# Patient Record
Sex: Male | Born: 1990 | Race: White | Hispanic: No | Marital: Single | Smoking: Never smoker
Health system: Southern US, Community
[De-identification: ages and names within clinical notes are randomized; demographics above are authoritative.]

---

## 2014-10-30 ENCOUNTER — Emergency Department (HOSPITAL_COMMUNITY)
Admission: EM | Admit: 2014-10-30 | Discharge: 2014-10-30 | Disposition: A | Discharge status: Home / Self Care | Payer: Self-pay | Attending: Emergency Medicine | Admitting: Emergency Medicine

## 2014-10-30 ENCOUNTER — Encounter (HOSPITAL_COMMUNITY): Payer: Self-pay | Admitting: *Deleted

## 2014-10-30 ENCOUNTER — Emergency Department (HOSPITAL_COMMUNITY): Payer: Self-pay

## 2014-10-30 DIAGNOSIS — R1011 Right upper quadrant pain: Secondary | ICD-10-CM | POA: Insufficient documentation

## 2014-10-30 DIAGNOSIS — R1110 Vomiting, unspecified: Secondary | ICD-10-CM | POA: Insufficient documentation

## 2014-10-30 DIAGNOSIS — R0789 Other chest pain: Secondary | ICD-10-CM | POA: Insufficient documentation

## 2014-10-30 DIAGNOSIS — R52 Pain, unspecified: Secondary | ICD-10-CM

## 2014-10-30 DIAGNOSIS — R109 Unspecified abdominal pain: Secondary | ICD-10-CM

## 2014-10-30 LAB — CBC WITH DIFFERENTIAL/PLATELET
BASOS ABS: 0 10*3/uL (ref 0.0–0.1)
BASOS PCT: 0 % (ref 0–1)
EOS ABS: 0.2 10*3/uL (ref 0.0–0.7)
Eosinophils Relative: 1 % (ref 0–5)
HEMATOCRIT: 43.8 % (ref 39.0–52.0)
HEMOGLOBIN: 14.4 g/dL (ref 13.0–17.0)
Lymphocytes Relative: 22 % (ref 12–46)
Lymphs Abs: 2.5 10*3/uL (ref 0.7–4.0)
MCH: 28.7 pg (ref 26.0–34.0)
MCHC: 32.9 g/dL (ref 30.0–36.0)
MCV: 87.3 fL (ref 78.0–100.0)
MONO ABS: 1 10*3/uL (ref 0.1–1.0)
Monocytes Relative: 9 % (ref 3–12)
NEUTROS ABS: 7.7 10*3/uL (ref 1.7–7.7)
Neutrophils Relative %: 68 % (ref 43–77)
Platelets: 246 10*3/uL (ref 150–400)
RBC: 5.02 MIL/uL (ref 4.22–5.81)
RDW: 14.3 % (ref 11.5–15.5)
WBC: 11.5 10*3/uL — AB (ref 4.0–10.5)

## 2014-10-30 LAB — URINALYSIS, ROUTINE W REFLEX MICROSCOPIC
Bilirubin Urine: NEGATIVE
Glucose, UA: NEGATIVE mg/dL
Hgb urine dipstick: NEGATIVE
Ketones, ur: NEGATIVE mg/dL
LEUKOCYTES UA: NEGATIVE
NITRITE: NEGATIVE
PROTEIN: NEGATIVE mg/dL
Specific Gravity, Urine: 1.01 (ref 1.005–1.030)
Urobilinogen, UA: 0.2 mg/dL (ref 0.0–1.0)
pH: 6 (ref 5.0–8.0)

## 2014-10-30 LAB — COMPREHENSIVE METABOLIC PANEL
ALT: 46 U/L (ref 0–53)
ANION GAP: 5 (ref 5–15)
AST: 32 U/L (ref 0–37)
Albumin: 4 g/dL (ref 3.5–5.2)
Alkaline Phosphatase: 69 U/L (ref 39–117)
BILIRUBIN TOTAL: 0.7 mg/dL (ref 0.3–1.2)
BUN: 16 mg/dL (ref 6–23)
CO2: 25 mmol/L (ref 19–32)
CREATININE: 1.01 mg/dL (ref 0.50–1.35)
Calcium: 9.6 mg/dL (ref 8.4–10.5)
Chloride: 108 mmol/L (ref 96–112)
GFR calc Af Amer: >90 mL/min (ref >90)
Glucose, Bld: 89 mg/dL (ref 70–99)
POTASSIUM: 4.5 mmol/L (ref 3.5–5.1)
Sodium: 138 mmol/L (ref 135–145)
Total Protein: 7.7 g/dL (ref 6.0–8.3)

## 2014-10-30 LAB — LIPASE, BLOOD: LIPASE: 23 U/L (ref 11–59)

## 2014-10-30 LAB — TROPONIN I: Troponin I: <0.03 ng/mL (ref <0.031)

## 2014-10-30 MED ORDER — HYDROMORPHONE HCL 1 MG/ML IJ SOLN
0.5000 mg | Freq: Once | INTRAMUSCULAR | Status: AC
  Administered 2014-10-30: 0.5 mg via INTRAVENOUS

## 2014-10-30 MED ORDER — TRAMADOL HCL 50 MG PO TABS: 50.0000 mg | ORAL_TABLET | Freq: Four times a day (QID) | ORAL | Status: AC | PRN

## 2014-10-30 MED ORDER — IOHEXOL 300 MG/ML  SOLN
50.0000 mL | Freq: Once | INTRAMUSCULAR | Status: AC | PRN
  Administered 2014-10-30: 50 mL via ORAL

## 2014-10-30 MED ORDER — IOHEXOL 300 MG/ML  SOLN
100.0000 mL | Freq: Once | INTRAMUSCULAR | Status: AC | PRN
  Administered 2014-10-30: 100 mL via INTRAVENOUS

## 2014-10-30 MED ORDER — HYDROMORPHONE HCL 1 MG/ML IJ SOLN
0.5000 mg | Freq: Once | INTRAMUSCULAR | Status: AC
  Administered 2014-10-30: 0.5 mg via INTRAVENOUS
  Filled 2014-10-30: qty 1

## 2014-10-30 MED ORDER — ONDANSETRON HCL 4 MG/2ML IJ SOLN
4.0000 mg | Freq: Once | INTRAMUSCULAR | Status: AC
  Administered 2014-10-30: 4 mg via INTRAVENOUS
  Filled 2014-10-30: qty 2

## 2014-10-30 MED ORDER — HYDROMORPHONE HCL 1 MG/ML IJ SOLN
INTRAMUSCULAR | Status: AC
  Administered 2014-10-30: 0.5 mg via INTRAVENOUS
  Filled 2014-10-30: qty 1

## 2014-10-30 MED ORDER — RANITIDINE HCL 150 MG PO CAPS: 150.0000 mg | ORAL_CAPSULE | Freq: Every day | ORAL | Status: AC

## 2014-10-30 MED ORDER — PROMETHAZINE HCL 25 MG PO TABS: 25.0000 mg | ORAL_TABLET | Freq: Four times a day (QID) | ORAL | Status: AC | PRN

## 2014-10-30 NOTE — ED Notes (Signed)
Abdominal pain, described as tightness. Entire back pain and tightness to chest since Friday. Pt sates vomiting since Monday. States he is unable to keep anything down. Headache also. NAD

## 2014-10-30 NOTE — Discharge Instructions (Signed)
Follow up with dr. Darrick PennaFields in 1-2 weeks if not improving.

## 2014-10-30 NOTE — ED Provider Notes (Signed)
CSN: [id number]     Arrival date & time 10/30/14  1033 History  This chart was scribed for Benny LennertJoseph L Ewel Lona, MD by Abel PrestoKara Demonbreun, ED Scribe. This patient was seen in room [facility]04/[facility]04 and the patient's care was started at 12:06 PM.    Chief Complaint  Patient presents with  . Abdominal Pain     Patient is a 24 y.o. male presenting with abdominal pain. The history is provided by the patient (the pt complains of upper abd pain). No language interpreter was used.  Abdominal Pain Pain location:  Epigastric Pain quality: aching   Pain radiates to:  Does not radiate Pain severity:  Moderate Onset quality:  Gradual Timing:  Intermittent Progression:  Waxing and waning Chronicity:  New Associated symptoms: vomiting   Associated symptoms: no chest pain, no cough, no diarrhea, no fatigue and no hematuria    HPI Comments: Steven Mercer is a 24 y.o. male who presents to the Emergency Department complaining of upper abdominal pain with onset 5 days ago. Pt states pain radiates to his sides. Pt notes associated chest tightness and vomiting for 3 days. Pt denies h/o of abdominal surgery and cholecystitis.   History reviewed. No pertinent past medical history. History reviewed. No pertinent past surgical history. No family history on file. History  Substance Use Topics  . Smoking status: Never Smoker   . Smokeless tobacco: Not on file  . Alcohol Use: Yes     Comment: Occ    Review of Systems  Constitutional: Negative for appetite change and fatigue.  HENT: Negative for congestion, ear discharge and sinus pressure.   Eyes: Negative for discharge.  Respiratory: Positive for chest tightness. Negative for cough.   Cardiovascular: Negative for chest pain.  Gastrointestinal: Positive for vomiting and abdominal pain. Negative for diarrhea.  Genitourinary: Negative for frequency and hematuria.  Musculoskeletal: Negative for back pain.  Skin: Negative for rash.  Neurological: Negative for  seizures and headaches.  Psychiatric/Behavioral: Negative for hallucinations.      Allergies  Review of patient's allergies indicates no known allergies.  Home Medications   Prior to Admission medications   Not on File   BP 133/80 mmHg  Pulse 83  Temp(Src) 99.2 F (37.3 C) (Oral)  Resp 18  Ht 6\' 1"  (1.854 m)  Wt 370 lb (167.831 kg)  BMI 48.83 kg/m2  SpO2 100% Physical Exam  Constitutional: He is oriented to person, place, and time. He appears well-developed and well-nourished.  HENT:  Head: Normocephalic.  Eyes: Conjunctivae and EOM are normal. No scleral icterus.  Neck: Neck supple. No thyromegaly present.  Cardiovascular: Normal rate, regular rhythm and normal heart sounds.  Exam reveals no gallop and no friction rub.   No murmur heard. Pulmonary/Chest: Effort normal and breath sounds normal. No stridor. He has no wheezes. He has no rales. He exhibits no tenderness.  Abdominal: He exhibits no distension. There is tenderness (moderate) in the right upper quadrant. There is no rebound.  Musculoskeletal: Normal range of motion. He exhibits no edema.  Lymphadenopathy:    He has no cervical adenopathy.  Neurological: He is alert and oriented to person, place, and time. He exhibits normal muscle tone. Coordination normal.  Skin: No rash noted. No erythema.  Psychiatric: He has a normal mood and affect. His behavior is normal.  Nursing note and vitals reviewed.   ED Course  Procedures (including critical care time) DIAGNOSTIC STUDIES: Oxygen Saturation is 100% on room air, normal by my interpretation.  COORDINATION OF CARE: 12:08 PM Discussed treatment plan with patient at beside, the patient agrees with the plan and has no further questions at this time.   Labs Review Labs Reviewed  CBC WITH DIFFERENTIAL/PLATELET  COMPREHENSIVE METABOLIC PANEL  LIPASE, BLOOD  URINALYSIS, ROUTINE W REFLEX MICROSCOPIC  TROPONIN I    Imaging Review No results found.   EKG  Interpretation None      MDM   Final diagnoses:  None   The chart was scribed for me under my direct supervision.  I personally performed the history, physical, and medical decision making and all procedures in the evaluation of this patient.Benny Lennert, MD 10/30/14 [phone]

## 2014-10-30 NOTE — ED Notes (Signed)
Ultrasound at the bedside

## 2015-05-02 ENCOUNTER — Emergency Department (HOSPITAL_COMMUNITY)
Admission: EM | Admit: 2015-05-02 | Discharge: 2015-05-02 | Disposition: A | Discharge status: Home / Self Care | Payer: Self-pay | Attending: Emergency Medicine | Admitting: Emergency Medicine

## 2015-05-02 ENCOUNTER — Encounter (HOSPITAL_COMMUNITY): Payer: Self-pay | Admitting: Emergency Medicine

## 2015-05-02 DIAGNOSIS — Y92810 Car as the place of occurrence of the external cause: Secondary | ICD-10-CM | POA: Insufficient documentation

## 2015-05-02 DIAGNOSIS — R519 Headache, unspecified: Secondary | ICD-10-CM

## 2015-05-02 DIAGNOSIS — Y9389 Activity, other specified: Secondary | ICD-10-CM | POA: Insufficient documentation

## 2015-05-02 DIAGNOSIS — S0990XA Unspecified injury of head, initial encounter: Secondary | ICD-10-CM | POA: Insufficient documentation

## 2015-05-02 DIAGNOSIS — W228XXA Striking against or struck by other objects, initial encounter: Secondary | ICD-10-CM | POA: Insufficient documentation

## 2015-05-02 DIAGNOSIS — R51 Headache: Secondary | ICD-10-CM

## 2015-05-02 DIAGNOSIS — Y998 Other external cause status: Secondary | ICD-10-CM | POA: Insufficient documentation

## 2015-05-02 MED ORDER — KETOROLAC TROMETHAMINE 30 MG/ML IJ SOLN
30.0000 mg | Freq: Once | INTRAMUSCULAR | Status: AC
  Administered 2015-05-02: 30 mg via INTRAVENOUS
  Filled 2015-05-02: qty 1

## 2015-05-02 MED ORDER — DIPHENHYDRAMINE HCL 50 MG/ML IJ SOLN
25.0000 mg | Freq: Once | INTRAMUSCULAR | Status: AC
  Administered 2015-05-02: 25 mg via INTRAVENOUS
  Filled 2015-05-02: qty 1

## 2015-05-02 MED ORDER — METOCLOPRAMIDE HCL 5 MG/ML IJ SOLN
10.0000 mg | Freq: Once | INTRAMUSCULAR | Status: AC
  Administered 2015-05-02: 10 mg via INTRAVENOUS
  Filled 2015-05-02: qty 2

## 2015-05-02 NOTE — ED Provider Notes (Signed)
CSN: [id number]     Arrival date & time 05/02/15  1431 History   First MD Initiated Contact with Patient 05/02/15 1501     Chief Complaint  Patient presents with  . Head Injury     (Consider location/radiation/quality/duration/timing/severity/associated sxs/prior Treatment) Patient is a 24 y.o. male presenting with head injury. The history is provided by the patient (pt complains of a headache when he hit his head on the door facing when he was getting in a car).  Head Injury Location:  Generalized Mechanism of injury: direct blow   Pain details:    Quality:  Aching   Progression:  Unchanged Chronicity:  New Relieved by:  Nothing Associated symptoms: no headaches and no seizures     History reviewed. No pertinent past medical history. History reviewed. No pertinent past surgical history. History reviewed. No pertinent family history. Social History  Substance Use Topics  . Smoking status: Never Smoker   . Smokeless tobacco: Current User    Types: Chew  . Alcohol Use: Yes     Comment: Occ    Review of Systems  Constitutional: Negative for appetite change and fatigue.  HENT: Negative for congestion, ear discharge and sinus pressure.   Eyes: Negative for discharge.  Respiratory: Negative for cough.   Cardiovascular: Negative for chest pain.  Gastrointestinal: Negative for abdominal pain and diarrhea.  Genitourinary: Negative for frequency and hematuria.  Musculoskeletal: Negative for back pain.  Skin: Negative for rash.  Neurological: Negative for seizures and headaches.  Psychiatric/Behavioral: Negative for hallucinations.      Allergies  Review of patient's allergies indicates no known allergies.  Home Medications   Prior to Admission medications   Medication Sig Start Date End Date Taking? Authorizing Provider  acetaminophen (TYLENOL) 500 MG tablet Take 500 mg by mouth every 6 (six) hours as needed.   Yes Historical Provider, MD  promethazine (PHENERGAN) 25  MG tablet Take 1 tablet (25 mg total) by mouth every 6 (six) hours as needed for nausea or vomiting. Patient not taking: Reported on 05/02/2015 10/30/14   Bethann Berkshire, MD  ranitidine (ZANTAC) 150 MG capsule Take 1 capsule (150 mg total) by mouth daily. Patient not taking: Reported on 05/02/2015 10/30/14   Bethann Berkshire, MD  traMADol (ULTRAM) 50 MG tablet Take 1 tablet (50 mg total) by mouth every 6 (six) hours as needed. Patient not taking: Reported on 05/02/2015 10/30/14   Bethann Berkshire, MD   BP 120/70 mmHg  Pulse 79  Temp(Src) 98.3 F (36.8 C) (Oral)  Resp 18  Ht 6\' 2"  (1.88 m)  Wt 362 lb (164.202 kg)  BMI 46.46 kg/m2  SpO2 100% Physical Exam  Constitutional: He is oriented to person, place, and time. He appears well-developed.  HENT:  Head: Normocephalic.  Eyes: Conjunctivae and EOM are normal. No scleral icterus.  Neck: Neck supple. No thyromegaly present.  Cardiovascular: Normal rate and regular rhythm.  Exam reveals no gallop and no friction rub.   No murmur heard. Pulmonary/Chest: No stridor. He has no wheezes. He has no rales. He exhibits no tenderness.  Abdominal: He exhibits no distension. There is no tenderness. There is no rebound.  Musculoskeletal: Normal range of motion. He exhibits no edema.  Lymphadenopathy:    He has no cervical adenopathy.  Neurological: He is oriented to person, place, and time. He exhibits normal muscle tone. Coordination normal.  Skin: No rash noted. No erythema.  Psychiatric: He has a normal mood and affect. His behavior is normal.  ED Course  Procedures (including critical care time) Labs Review Labs Reviewed - No data to display  Imaging Review No results found. I have personally reviewed and evaluated these images and lab results as part of my medical decision-making.   EKG Interpretation None      MDM   Final diagnoses:  Headache behind the eyes   Minor head trauma causing a minor headache.  Pt improved with tx.      Bethann Berkshire, MD 05/02/15 [phone]

## 2015-05-02 NOTE — ED Notes (Signed)
Patient wanting to go home, EDP notified.

## 2015-05-02 NOTE — ED Notes (Addendum)
Pt reports hit head on the top of a car last night while trying to get in the back seat. Pt denies loc. Pt reports nausea and dizziness since last night.pt alert and oriented.airway patent. Pt reports "everything seems dim." nad noted.

## 2015-05-02 NOTE — Discharge Instructions (Signed)
Tylenol or motrin for pain.  Follow up as needed °

## 2016-05-13 IMAGING — CT CT ABD-PELV W/ CM
2 of 4 series · 16 of 46 positions shown, 18 images · IV contrast (Omnipaque 300)
Comparison: None.

CLINICAL DATA: Abdominal pain, back pain, vomiting starting [REDACTED]
headache

EXAM:
CT ABDOMEN AND PELVIS WITH CONTRAST
TECHNIQUE: Multidetector CT imaging of the abdomen and pelvis was performed
using the standard protocol following bolus administration of
intravenous contrast.
CONTRAST:  100mL OMNIPAQUE IOHEXOL 300 MG/ML SOLN, 50mL OMNIPAQUE
IOHEXOL 300 MG/ML SOLN

[Series 2: abd_pel_with 5.0 b40f · axial · 0.98mm/px · z∈[-553,-103]mm · 13 of 100 slices shown, 15 images]
[im 5/100  soft-tissue]
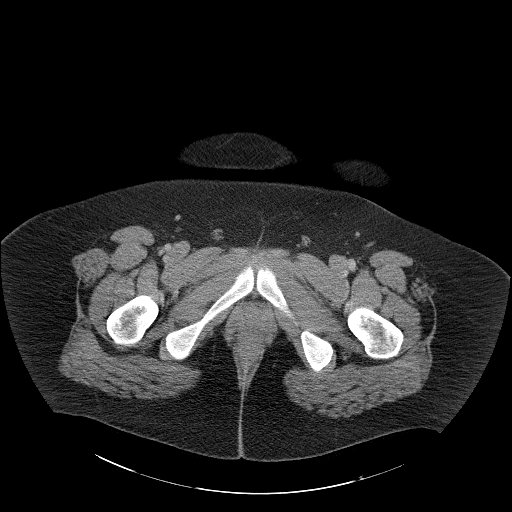
[im 5/100  bone]
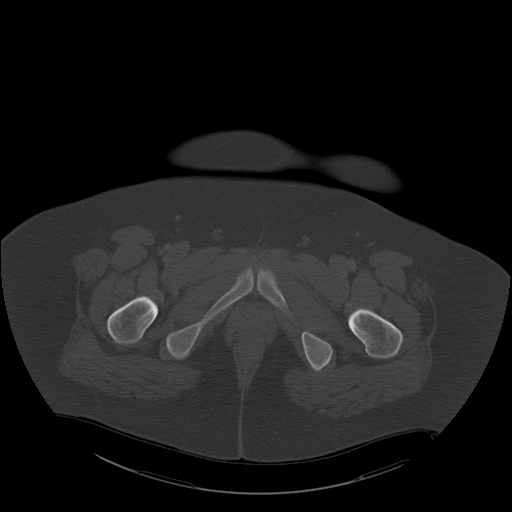
[im 13/100  soft-tissue]
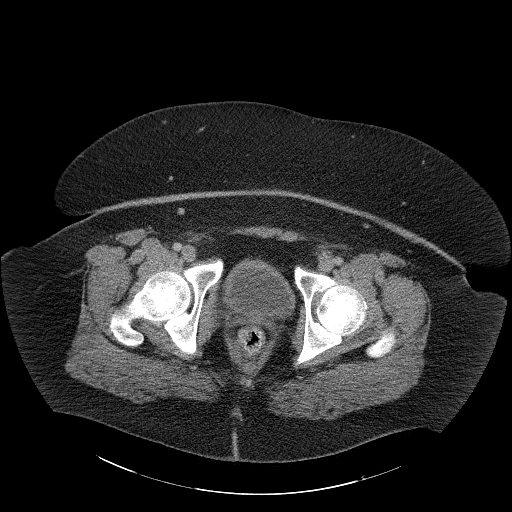
[im 22/100  soft-tissue]
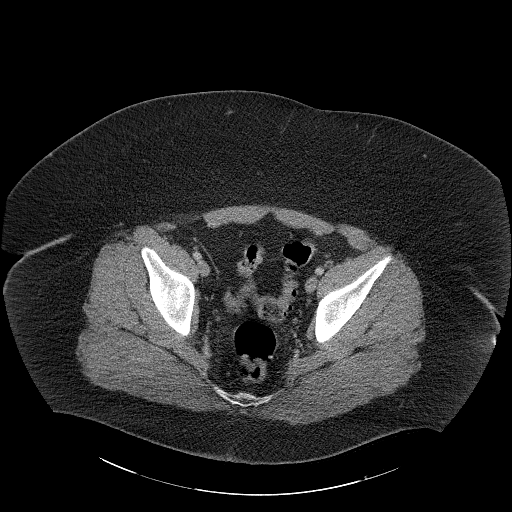
[im 26/100  soft-tissue]
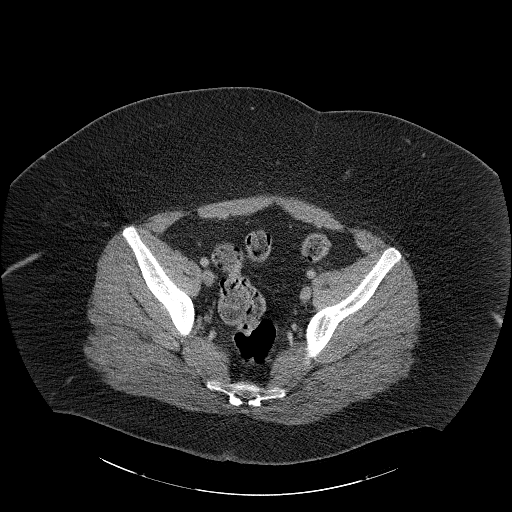
[im 35/100  soft-tissue]
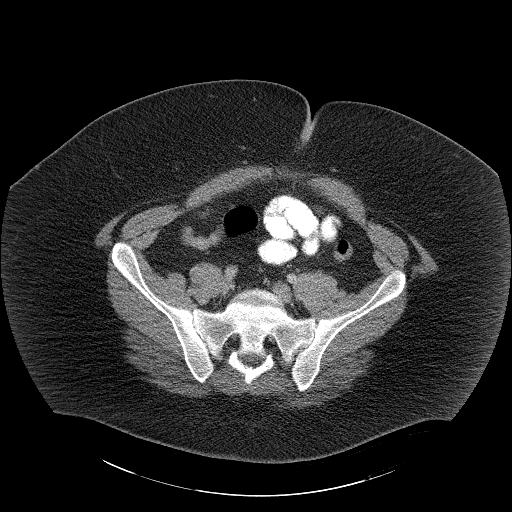
[im 44/100  soft-tissue]
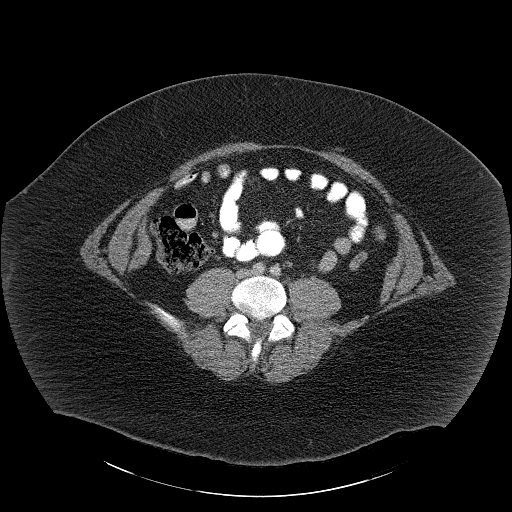
[im 52/100  soft-tissue]
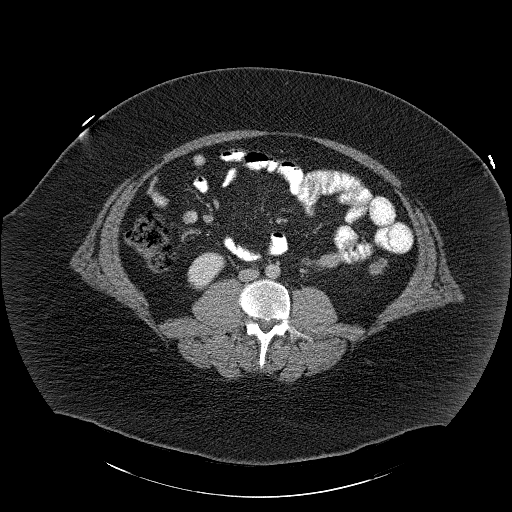
[im 56/100  soft-tissue]
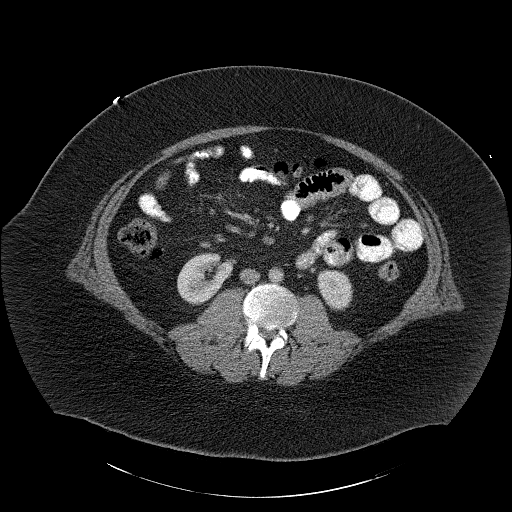
[im 65/100  soft-tissue]
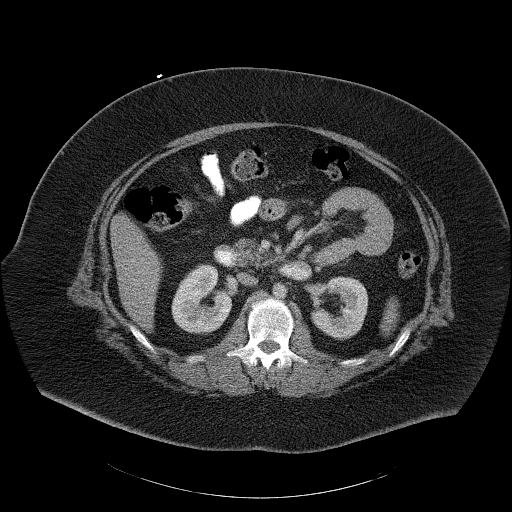
[im 65/100  bone]
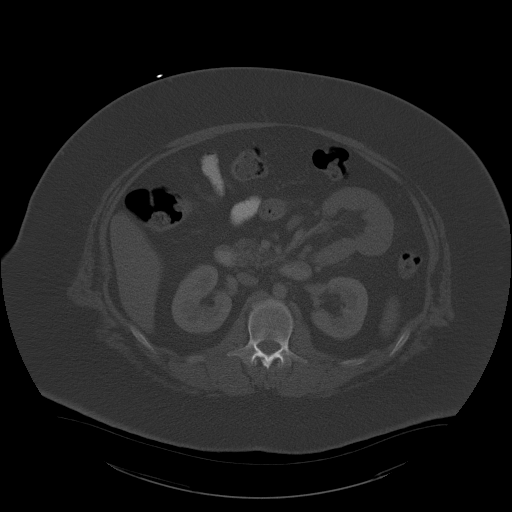
[im 74/100  soft-tissue]
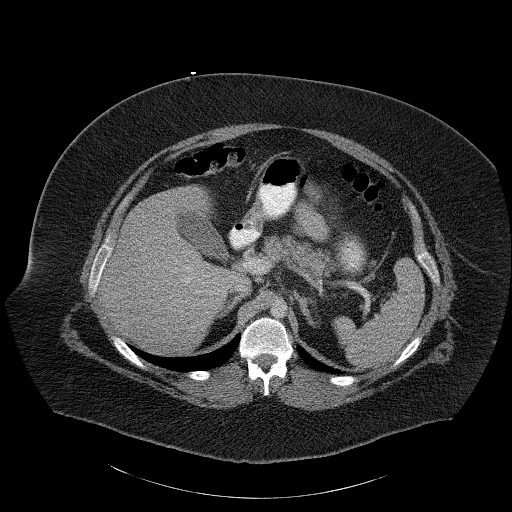
[im 78/100  soft-tissue]
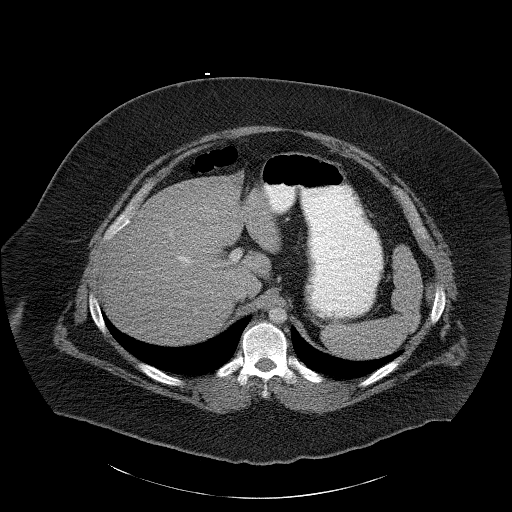
[im 87/100  soft-tissue]
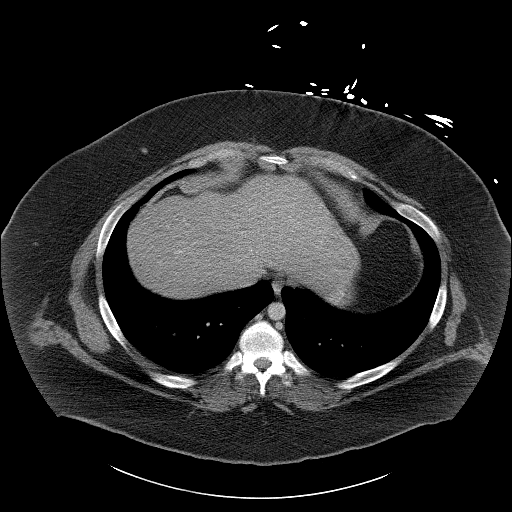
[im 95/100  soft-tissue]
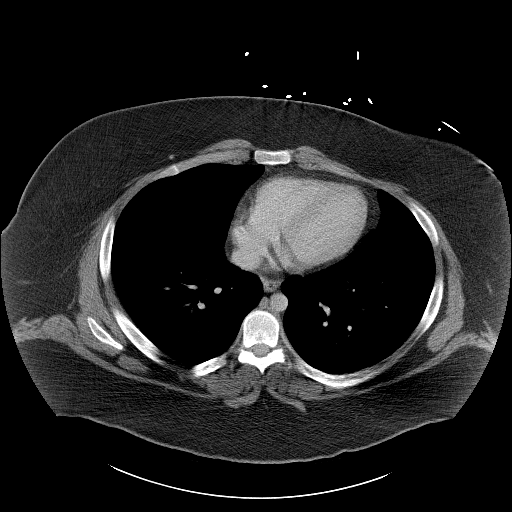

[Series 3: abd_pel_with 3.0 spo cor · coronal · 0.84mm/px · 3 of 119 slices shown]
[im 40/119  soft-tissue]
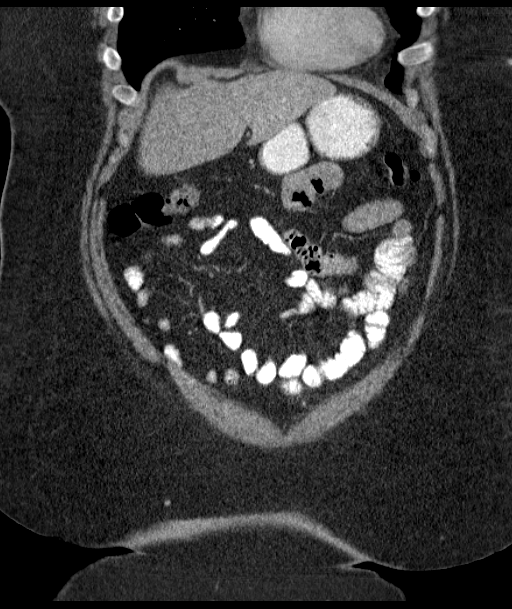
[im 53/119  soft-tissue]
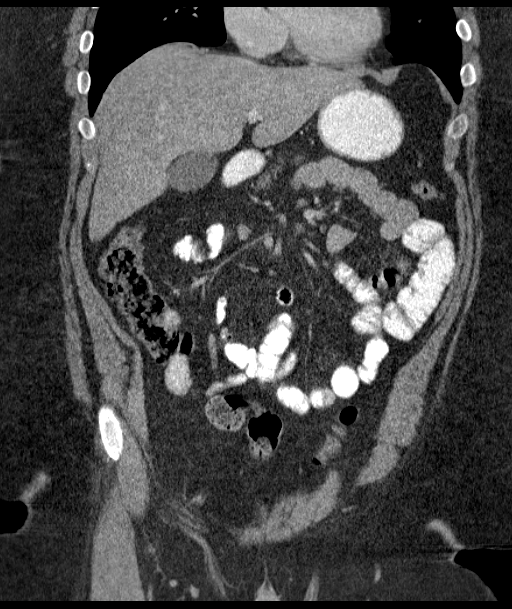
[im 66/119  soft-tissue]
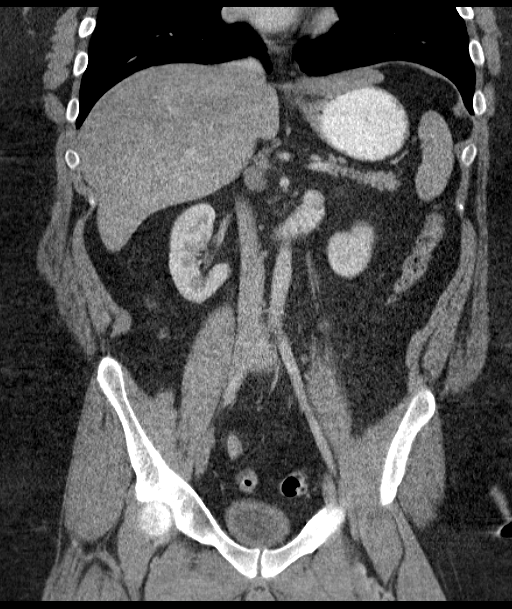

[16 of 46 positions shown; findings below may reference images not displayed]

FINDINGS: Sagittal images of the spine shows mild degenerative changes
thoracolumbar spine. The lung bases are unremarkable. There are
streaky artifacts from patient's large body habitus.

There is fatty infiltration of the liver. No calcified gallstones
are noted within gallbladder. No intrahepatic biliary ductal
dilatation. The pancreas, spleen and adrenal glands are
unremarkable.

Kidneys are symmetrical in size and enhancement. No hydronephrosis
or hydroureter.

No focal renal mass.  Abdominal aorta is unremarkable.

There is no small bowel obstruction. No ascites or free air. No
adenopathy.

There is no pericecal inflammation. Normal appendix clearly
visualize in axial image 63. Normal appendix is confirmed in coronal
image 54.

Tiny umbilical hernia containing fat without evidence of acute
complication. Some stool are noted in right colon and transverse
colon. Moderate stool noted in distal sigmoid colon. Stool and gas
noted within rectum without significant distension. There is mild
thickening of urinary bladder wall. Clinical correlation is
necessary to exclude cystitis. Prostate gland and seminal vesicles
are unremarkable. No inguinal adenopathy. No destructive bony
lesions are noted within pelvis.
IMPRESSION: 1. There is fatty infiltration of the liver.
2. No hydronephrosis or hydroureter.
3. No small bowel obstruction. No pericecal inflammation. Normal
appendix.
4. Mild thickening of urinary bladder wall. Clinical correlation is
necessary to exclude cystitis.

## 2016-05-13 IMAGING — US US ABDOMEN COMPLETE
1 series · 13 of 25 positions shown · non-contrast
Comparison: None.

CLINICAL DATA: Abdominal pain with nausea and vomiting for 3-5
days.

EXAM:
ULTRASOUND ABDOMEN COMPLETE

[Series 1: us abdomen complete · 0.33mm/px · 13 of 66 slices shown]
[im 1/66]
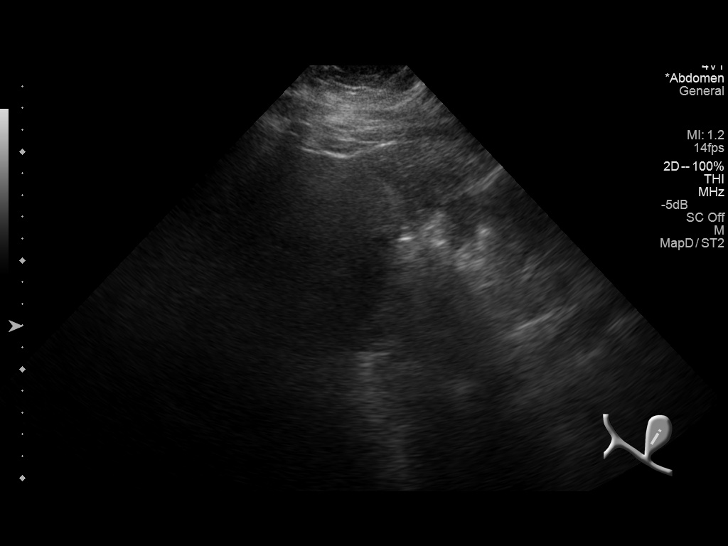
[im 6/66]
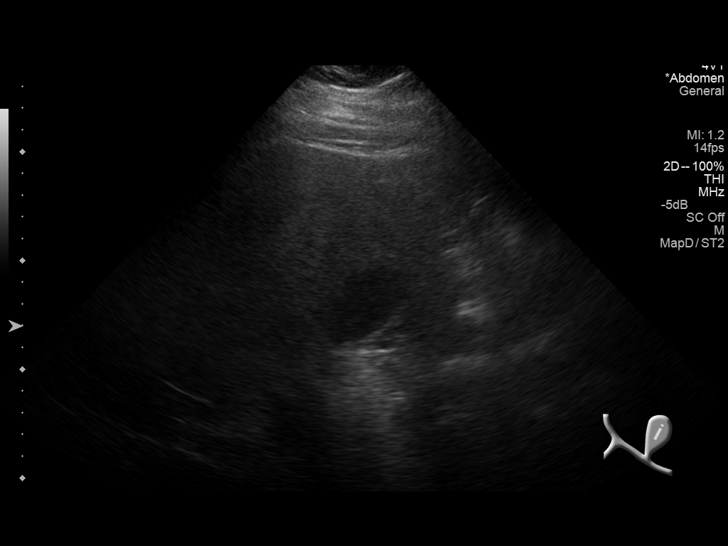
[im 11/66]
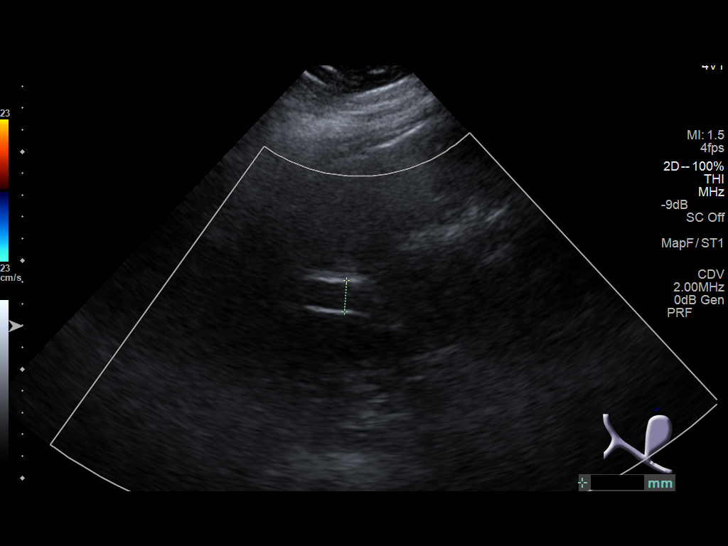
[im 17/66]
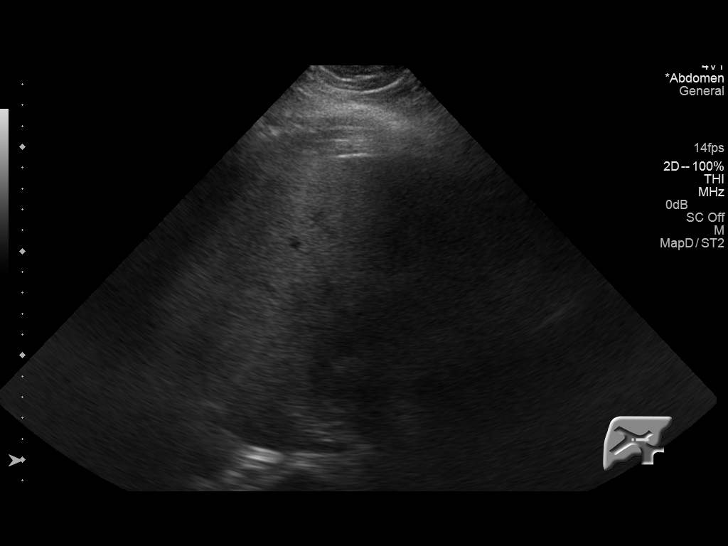
[im 22/66]
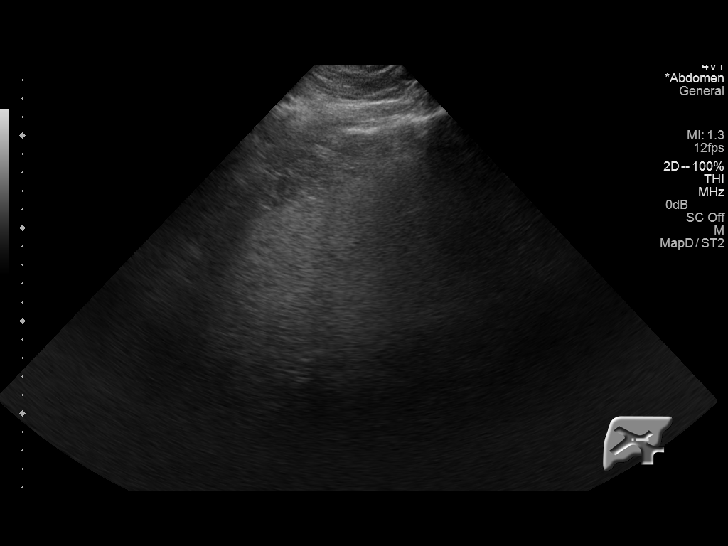
[im 28/66]
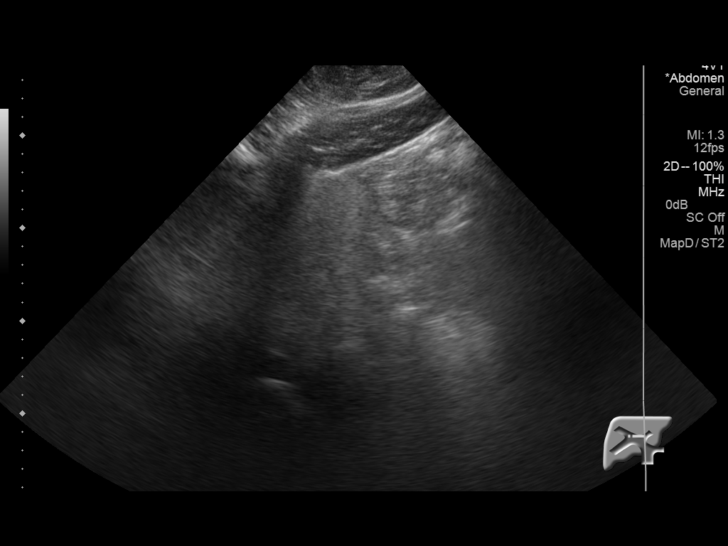
[im 33/66]
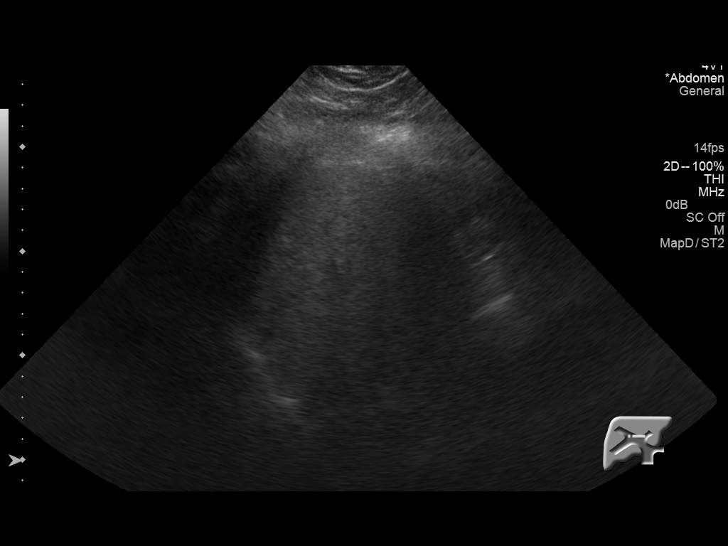
[im 38/66]
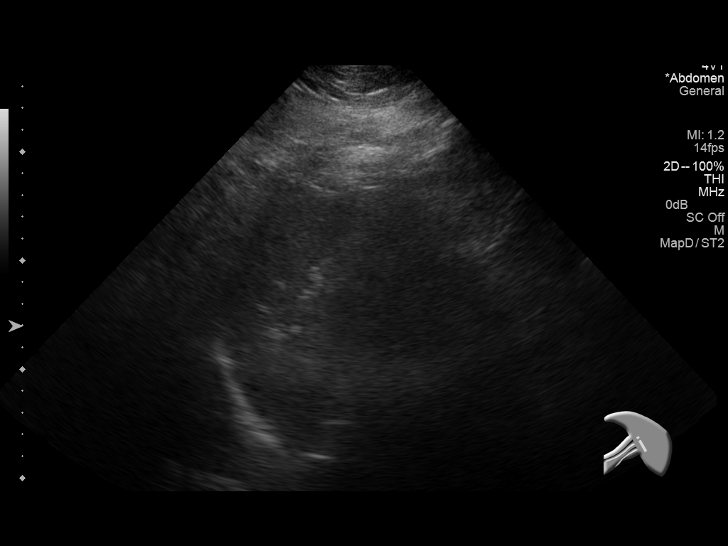
[im 44/66]
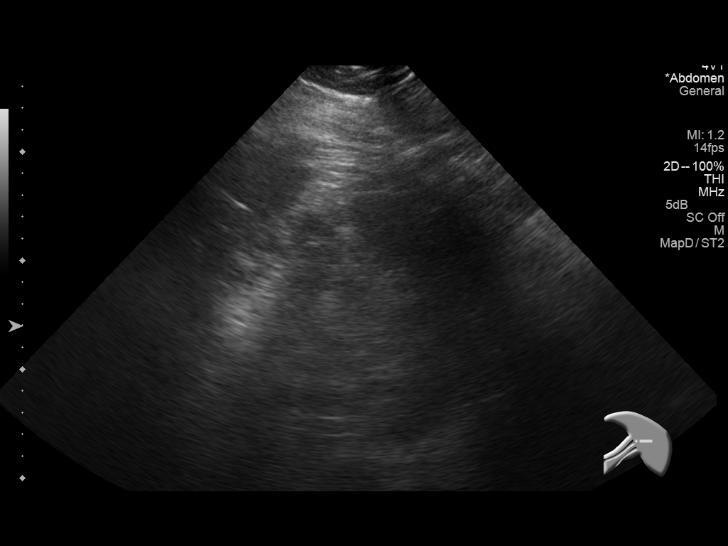
[im 49/66]
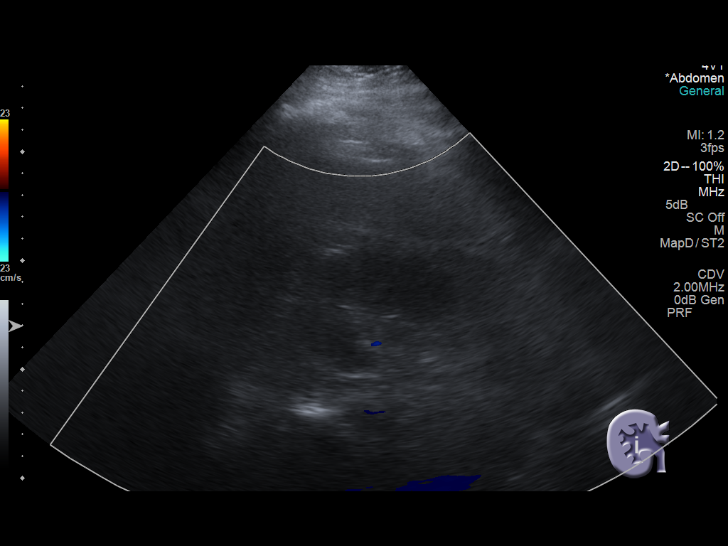
[im 55/66]
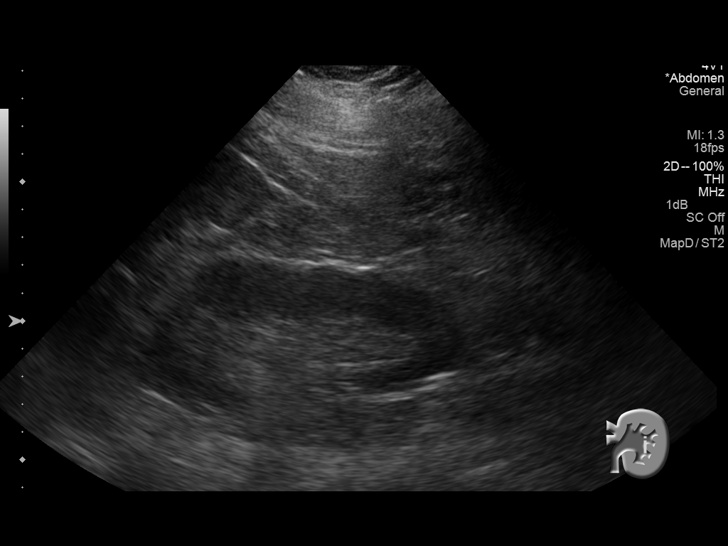
[im 60/66]
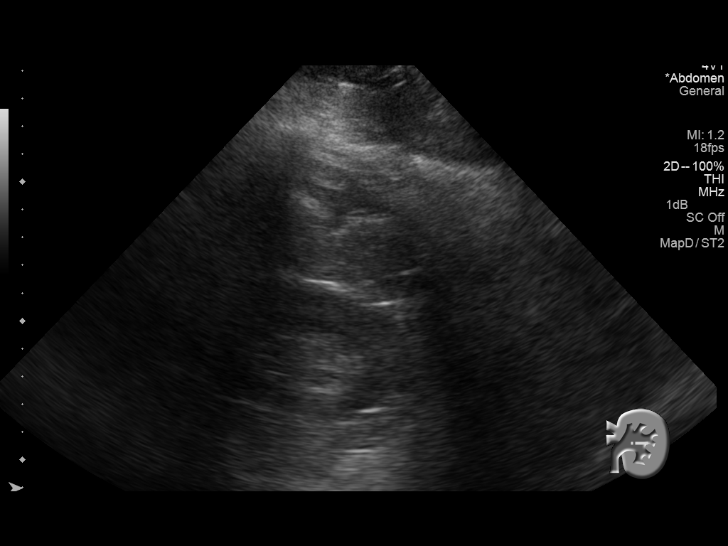
[im 66/66]
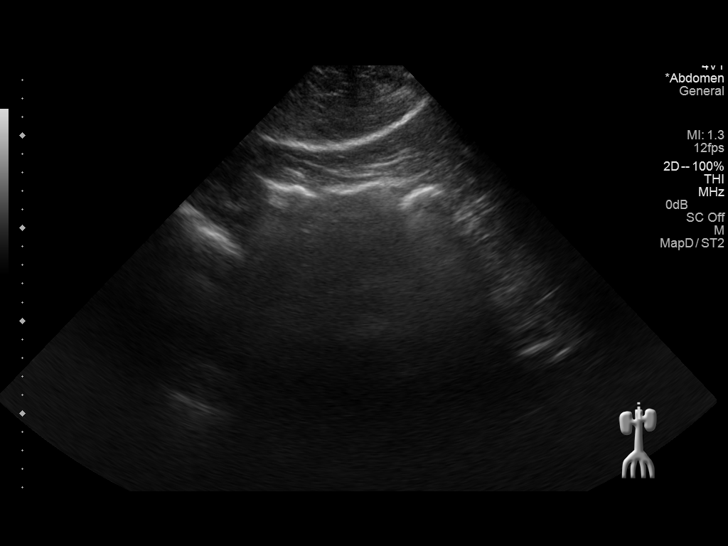

[13 of 25 positions shown; findings below may reference images not displayed]

FINDINGS: Limited study due to bowel gas and patient size.

Gallbladder: No gallstones or wall thickening visualized. No
sonographic Murphy sign noted.

Common bile duct: The common bile duct is definite to confidently
identify, but the best candidate measures 14 mm, markedly abnormal.
Where visualized, no filling defect.

Liver: Markedly echogenic liver, with poor acoustic transmission and
sonographic evaluation.

IVC: Not visualized due to bowel gas

Pancreas: Not visualized due to bowel gas

Spleen: Size and appearance within normal limits.

Right Kidney: Length: 11 cm. Echogenicity within normal limits. No
mass or hydronephrosis visualized.

Left Kidney: Length: 12 cm. Echogenicity within normal limits. No
mass or hydronephrosis visualized.

Abdominal aorta: Not visible due to bowel gas

Other findings: None.
IMPRESSION: 1. Limited study due to bowel gas, patient size, and hepatic
steatosis.
2. The common bile duct is difficult to confidently identify, but
findings suggest marked dilatation (14 mm). Based on age this could
be from duct obstruction or choledochal cyst. Before further
imaging, recommend liver function tests.
3. No evidence for cholelithiasis.
